# Patient Record
Sex: Female | Born: 2000 | Hispanic: Yes | Marital: Single | Smoking: Never smoker
Health system: Southern US, Community
[De-identification: ages and names within clinical notes are randomized; demographics above are authoritative.]

## PROBLEM LIST (undated history)

## (undated) DIAGNOSIS — Z8709 Personal history of other diseases of the respiratory system: Secondary | ICD-10-CM

## (undated) HISTORY — DX: Personal history of other diseases of the respiratory system: Z87.09

---
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
## 2022-03-26 ENCOUNTER — Other Ambulatory Visit (HOSPITAL_COMMUNITY)
Admission: RE | Admit: 2022-03-26 | Discharge: 2022-03-26 | Disposition: A | Discharge status: Home / Self Care | Payer: Medicaid Other | Source: Ambulatory Visit | Attending: Pediatrics | Admitting: Pediatrics

## 2022-03-26 ENCOUNTER — Ambulatory Visit (INDEPENDENT_AMBULATORY_CARE_PROVIDER_SITE_OTHER): Payer: Medicaid Other | Admitting: Pediatrics

## 2022-03-26 ENCOUNTER — Encounter: Payer: Self-pay | Admitting: Pediatrics

## 2022-03-26 VITALS — BP 102/64 | HR 67 | Ht 59.21 in | Wt 137.2 lb

## 2022-03-26 DIAGNOSIS — Z113 Encounter for screening for infections with a predominantly sexual mode of transmission: Secondary | ICD-10-CM

## 2022-03-26 DIAGNOSIS — Z1331 Encounter for screening for depression: Secondary | ICD-10-CM

## 2022-03-26 DIAGNOSIS — R5383 Other fatigue: Secondary | ICD-10-CM

## 2022-03-26 DIAGNOSIS — Z23 Encounter for immunization: Secondary | ICD-10-CM

## 2022-03-26 DIAGNOSIS — Z114 Encounter for screening for human immunodeficiency virus [HIV]: Secondary | ICD-10-CM | POA: Diagnosis not present

## 2022-03-26 DIAGNOSIS — Z0001 Encounter for general adult medical examination with abnormal findings: Secondary | ICD-10-CM | POA: Diagnosis not present

## 2022-03-26 DIAGNOSIS — Z130 Encounter for screening for diseases of the blood and blood-forming organs and certain disorders involving the immune mechanism: Secondary | ICD-10-CM | POA: Diagnosis not present

## 2022-03-26 DIAGNOSIS — Z1339 Encounter for screening examination for other mental health and behavioral disorders: Secondary | ICD-10-CM | POA: Diagnosis not present

## 2022-03-26 DIAGNOSIS — L700 Acne vulgaris: Secondary | ICD-10-CM

## 2022-03-26 LAB — POCT RAPID HIV: Rapid HIV, POC: NEGATIVE

## 2022-03-26 LAB — POCT HEMOGLOBIN: Hemoglobin: 13.4 g/dL (ref 11–14.6)

## 2022-03-26 MED ORDER — ADAPALENE 0.1 % EX CREA: TOPICAL_CREAM | CUTANEOUS | 6 refills | Status: AC

## 2022-03-26 MED ORDER — LEVOCETIRIZINE DIHYDROCHLORIDE 5 MG PO TABS: 5.0000 mg | ORAL_TABLET | Freq: Every evening | ORAL | 6 refills | Status: DC

## 2022-03-26 MED ORDER — TRIAMCINOLONE ACETONIDE 0.025 % EX OINT: 1.0000 | TOPICAL_OINTMENT | Freq: Two times a day (BID) | CUTANEOUS | 0 refills | Status: AC

## 2022-03-26 MED ORDER — CLINDAMYCIN PHOS-BENZOYL PEROX 1-5 % EX GEL: Freq: Every day | CUTANEOUS | 4 refills | Status: DC

## 2022-03-26 MED ORDER — FLUTICASONE PROPIONATE 50 MCG/ACT NA SUSP: 2.0000 | Freq: Every day | NASAL | 12 refills | Status: DC

## 2022-03-26 NOTE — Patient Instructions (Signed)
Adult Primary Care Clinics Name Criteria Services   [facility]  Address: [address] [place], [place] [address]  Phone: [phone] Hours: Monday - Friday 9 AM -6 PM  Types of insurance accepted:  Commercial insurance [facility] (orange card) Medicaid Medicare Uninsured  Language services:  Video and phone interpreters available   Ages 18 and older    Adult primary care Onsite pharmacy Integrated behavioral health Financial assistance counseling Walk-in hours for established patients  Financial assistance counseling hours: Tuesdays 2:00PM - 5:00PM  Thursday 8:30AM - 4:30PM  Space is limited, 10 on Tuesday and 20 on Thursday. It's on first come first serve basis  Name Criteria Services   [facility]  Address: [address] [place], [place] [address]  Phone: [phone]  Hours: Monday - Friday 8:30 AM - 5 PM  Types of insurance accepted:  Commercial insurance Medicaid Medicare Uninsured  Language services:  Video and phone interpreters available   All ages - newborn to adult   Primary care for all ages (children and adults) Integrated behavioral health Nutritionist Financial assistance counseling   Name Criteria Services   [facility]  Located on the ground floor of [facility]  Address: [address]  [place],  [place]  [address]  Phone: [phone]  Hours: Monday - Friday 8:15 AM - 5 PM  Types of insurance accepted:  Commercial insurance Medicaid Medicare Uninsured  Language services:  Video and phone interpreters available   Ages 18 and older   Adult primary care Nutritionist Certified Diabetes Educator  Integrated behavioral health Financial assistance counseling   Name Criteria Services   [facility]  Address: [address] [place], [place] [address]  Phone:  [phone]  Hours: Monday - Friday 8:30 AM - 5 PM    Types of insurance accepted:  Commercial insurance Medicaid Medicare Uninsured  Language services:  Video and phone interpreters available   All ages - newborn to adult   Primary care for all ages (children and adults) Integrated behavioral health Financial assistance counseling      

## 2022-03-26 NOTE — Progress Notes (Signed)
Adolescent Well Care Visit Gwendolyn Walsh is a 21 y.o. female who is here for well care.     PCP:  Lady Deutscher, MD   History was provided by the patient.  Confidentiality was discussed with the patient and, if applicable, with caregiver.   Current Issues: Current concerns include how to get an adult doctor? Spot on hand--washes hands a lot at [employer]. Itches. 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  no  Education: School Grade: completed   Menstruation:   Patient's last menstrual period was 02/20/2022. Menstrual History: normal  Patient has a dental home: yes   Confidential social history: Tobacco?  no Secondhand smoke exposure? no Drugs/ETOH?  no  Sexually Active?  no   Pregnancy Prevention: n/a  Safe at home, in school & in relationships? yes Safe to self?  Yes   Screenings:  The patient completed the Rapid Assessment for Adolescent Preventive Services screening questionnaire and the following topics were identified as risk factors and discussed: drug use, condom use, and birth control  In addition, the following topics were discussed as part of anticipatory guidance: pregnancy prevention, depression/anxiety.  PHQ-9 completed and results indicated 3  Physical Exam:  Vitals:   03/26/22 1026  BP: 102/64  Pulse: 67  SpO2: 97%  Weight: 137 lb 3.2 oz (62.2 kg)  Height: 4' 11.21" (1.504 m)   BP 102/64 (BP Location: Right Arm, Patient Position: Sitting, Cuff Size: Normal)   Pulse 67   Ht 4' 11.21" (1.504 m)   Wt 137 lb 3.2 oz (62.2 kg)   LMP  02/20/2022   SpO2 97%   BMI 27.51 kg/m  Body mass index: body mass index is 27.51 kg/m. Blood pressure %iles are not available for patients who are 18 years or older.  Hearing Screening  Method: Audiometry   500Hz  1000Hz  2000Hz  4000Hz   Right ear 20 20 20 20   Left ear 20 20 20 20    Vision Screening   Right eye Left eye Both eyes  Without correction 20/25 20/25 20/20   With correction     Comments: Does wear glasses but doesn't have them.    General: well developed, no acute distress, gait normal HEENT: PERRL, normal oropharynx, TMs normal bilaterally Neck: supple, no lymphadenopathy CV: RRR no murmur noted PULM: normal aeration throughout all lung fields, no crackles or wheezes Abdomen: soft, non-tender; no masses or HSM Extremities: warm and well perfused Gu: deferred Skin: small areas of dry skin in between digits  Neuro: alert and oriented, moves all extremities equally   Assessment and Plan:  Gwendolyn Walsh is a 21 y.o. female who is here for well care.   #Well teen: -BMI is appropriate for age -Discussed anticipatory guidance including pregnancy/STI prevention, alcohol/drug use, safety in the car and around water -Screens: Hearing screening result:normal; Vision screening result: normal  #Need for vaccination:  -Counseling provided for all vaccine components  Orders Placed This Encounter  Procedures   Flu Vaccine QUAD 44mo+IM (Fluarix, Fluzone & Alfiuria Quad PF)   POCT Rapid HIV   POCT hemoglobin   #Fatigue: poc hgb normal - likely related to new  schedule opening [employer]   #Dry skin dermatitis: - Rx TAC for areas on hands ?dyshidrotic eczema  - continue with good moisturizer frequently  #Acne: - refill meds.   No follow-ups on file.Lady Deutscher, MD

## 2022-03-27 LAB — URINE CYTOLOGY ANCILLARY ONLY
Chlamydia: NEGATIVE
Comment: NEGATIVE
Comment: NORMAL
Neisseria Gonorrhea: NEGATIVE

## 2022-04-08 ENCOUNTER — Emergency Department (HOSPITAL_COMMUNITY): Payer: Medicaid Other

## 2022-04-08 ENCOUNTER — Emergency Department (HOSPITAL_COMMUNITY)
Admission: EM | Admit: 2022-04-08 | Discharge: 2022-04-09 | Discharge status: Against Medical Advice | Payer: Medicaid Other | Attending: Emergency Medicine | Admitting: Emergency Medicine

## 2022-04-08 ENCOUNTER — Encounter (HOSPITAL_COMMUNITY): Payer: Self-pay

## 2022-04-08 DIAGNOSIS — R1013 Epigastric pain: Secondary | ICD-10-CM | POA: Insufficient documentation

## 2022-04-08 DIAGNOSIS — Z5321 Procedure and treatment not carried out due to patient leaving prior to being seen by health care provider: Secondary | ICD-10-CM | POA: Insufficient documentation

## 2022-04-08 DIAGNOSIS — R0789 Other chest pain: Secondary | ICD-10-CM | POA: Diagnosis present

## 2022-04-08 DIAGNOSIS — R1010 Upper abdominal pain, unspecified: Secondary | ICD-10-CM | POA: Insufficient documentation

## 2022-04-08 DIAGNOSIS — R112 Nausea with vomiting, unspecified: Secondary | ICD-10-CM | POA: Insufficient documentation

## 2022-04-08 LAB — CBC WITH DIFFERENTIAL/PLATELET
Abs Immature Granulocytes: 0.03 10*3/uL (ref 0.00–0.07)
Basophils Absolute: 0 10*3/uL (ref 0.0–0.1)
Basophils Relative: 0 %
Eosinophils Absolute: 0.1 10*3/uL (ref 0.0–0.5)
Eosinophils Relative: 1 %
HCT: 45.8 % (ref 36.0–46.0)
Hemoglobin: 15 g/dL (ref 12.0–15.0)
Immature Granulocytes: 0 %
Lymphocytes Relative: 13 %
Lymphs Abs: 1.1 10*3/uL (ref 0.7–4.0)
MCH: 29.1 pg (ref 26.0–34.0)
MCHC: 32.8 g/dL (ref 30.0–36.0)
MCV: 88.9 fL (ref 80.0–100.0)
Monocytes Absolute: 0.3 10*3/uL (ref 0.1–1.0)
Monocytes Relative: 3 %
Neutro Abs: 6.9 10*3/uL (ref 1.7–7.7)
Neutrophils Relative %: 83 %
Platelets: 289 10*3/uL (ref 150–400)
RBC: 5.15 MIL/uL — ABNORMAL HIGH (ref 3.87–5.11)
RDW: 12.5 % (ref 11.5–15.5)
WBC: 8.3 10*3/uL (ref 4.0–10.5)
nRBC: 0 % (ref 0.0–0.2)

## 2022-04-08 LAB — COMPREHENSIVE METABOLIC PANEL
ALT: 13 U/L (ref 0–44)
AST: 19 U/L (ref 15–41)
Albumin: 4.3 g/dL (ref 3.5–5.0)
Alkaline Phosphatase: 50 U/L (ref 38–126)
Anion gap: 9 (ref 5–15)
BUN: 9 mg/dL (ref 6–20)
CO2: 26 mmol/L (ref 22–32)
Calcium: 9.1 mg/dL (ref 8.9–10.3)
Chloride: 104 mmol/L (ref 98–111)
Creatinine, Ser: 0.78 mg/dL (ref 0.44–1.00)
GFR, Estimated: >60 mL/min (ref >60)
Glucose, Bld: 112 mg/dL — ABNORMAL HIGH (ref 70–99)
Potassium: 3.7 mmol/L (ref 3.5–5.1)
Sodium: 139 mmol/L (ref 135–145)
Total Bilirubin: 0.5 mg/dL (ref 0.3–1.2)
Total Protein: 7.3 g/dL (ref 6.5–8.1)

## 2022-04-08 LAB — I-STAT BETA HCG BLOOD, ED (MC, WL, AP ONLY): I-stat hCG, quantitative: <5 m[IU]/mL (ref <5)

## 2022-04-08 LAB — LIPASE, BLOOD: Lipase: 39 U/L (ref 11–51)

## 2022-04-08 MED ORDER — ONDANSETRON 4 MG PO TBDP
4.0000 mg | ORAL_TABLET | Freq: Once | ORAL | Status: AC
Start: 2022-04-08 — End: 2022-04-08
  Administered 2022-04-08: 4 mg via ORAL
  Filled 2022-04-08: qty 1

## 2022-04-08 NOTE — ED Provider Triage Note (Signed)
Emergency Medicine Provider Triage Evaluation Note  Gwendolyn Walsh , a 21 y.o. female  was evaluated in triage.  Pt complains of chest pain that started yesterday just left of mid chest.  It was intermittent.  Today she began to feel worse with nausea.  She is actively vomiting after arrival to triage.  She reports upper abdominal pain as well.  Denies NSAIDs, has taken some Tylenol.  No abdominal surgery history.  Pain is in the epigastrium.  Denies URI symptoms.  History interrupted by vomiting.  Review of Systems  Positive: Chest pain, abdominal pain, vomiting Negative: Fever  Physical Exam  LMP 02/20/2022  Gen:   Awake, actively vomiting Resp:  Normal effort  MSK:   Moves extremities without difficulty  Other:    Medical Decision Making  Medically screening exam initiated at 10:08 PM.  Appropriate orders placed.  Gwendolyn Walsh was informed that the remainder of the evaluation will be completed by another provider, this initial triage assessment does not replace that evaluation, and the importance of remaining in the ED until their evaluation is complete.     Renne Crigler, PA-C 04/08/22 2210

## 2022-04-08 NOTE — ED Triage Notes (Signed)
Pt reports that she has been having CP since yesterday and abd pain today with nausea

## 2022-04-09 ENCOUNTER — Encounter: Payer: Self-pay | Admitting: Pediatrics

## 2022-04-09 ENCOUNTER — Other Ambulatory Visit: Payer: Self-pay | Admitting: Pediatrics

## 2022-04-09 ENCOUNTER — Ambulatory Visit (INDEPENDENT_AMBULATORY_CARE_PROVIDER_SITE_OTHER): Payer: Medicaid Other | Admitting: Pediatrics

## 2022-04-09 ENCOUNTER — Other Ambulatory Visit: Payer: Self-pay

## 2022-04-09 VITALS — HR 84 | Temp 98.4°F | Wt 135.2 lb

## 2022-04-09 DIAGNOSIS — R079 Chest pain, unspecified: Secondary | ICD-10-CM

## 2022-04-09 DIAGNOSIS — K219 Gastro-esophageal reflux disease without esophagitis: Secondary | ICD-10-CM | POA: Insufficient documentation

## 2022-04-09 DIAGNOSIS — Z0000 Encounter for general adult medical examination without abnormal findings: Secondary | ICD-10-CM

## 2022-04-09 HISTORY — DX: Chest pain, unspecified: R07.9

## 2022-04-09 MED ORDER — OMEPRAZOLE 20 MG PO CPDR: 20.0000 mg | DELAYED_RELEASE_CAPSULE | Freq: Every day | ORAL | 0 refills | Status: DC

## 2022-04-09 NOTE — Assessment & Plan Note (Signed)
Differential remains broad given the vague presentation.  No evidence of pulmonary embolism given hemodynamically stable in office without evidence of tachypnea and normal oxygen saturation.  She does not appear to have an infectious etiology as she has no fever/chills or systemic symptoms.  Chest x-ray unremarkable.  She could have a component of GERD for which we are treating with PPI in addition to anxiety or inflammatory pulmonary symptoms after starting vaping.  Patient has stopped vaping at this time, discussed that she needs to discontinue this for good.  Patient does not have any history of cardiac complications nor does she seem to have any red flag symptoms that would point to concerns for MI, ischemia, arrhythmia.  Patient remains euvolemic on exam and EKG unremarkable from the ED visit.  She does have Zofran at home for which she is using for vomiting.  The plan moving forward will consist of a PPI that she will need to use daily for the next few weeks in addition to Zofran as needed.  If she continues to experience the chest pain, I will have her return to the family medicine clinic as she is of age for an adult provider.  Phone number for family medicine clinic provided.  She was instructed to continue with a new patient appointment with me at [facility] in the next couple of weeks if the chest pain persists for likely repeat EKG and further assessment.  Reassuringly, abdominal exam is unremarkable and she is nonperitoneal.  There is no concern for STI, ectopic pregnancy, ovarian torsion given the history provided.  Return precautions given.  She does need to hepatitis C lab, no lab provider here today.  Will obtain this at her visit at [facility].

## 2022-04-09 NOTE — Progress Notes (Signed)
Acute Office Visit  Subjective:     Patient ID: Gwendolyn Walsh, female    DOB: 2000/07/30, 21 y.o.   MRN: [id number]  Chief Complaint  Patient presents with   Chest Pain    Mid left sided chest pain started Saturday after vaping.  Sunday morning pain moved to upper abdomen.     HPI Patient is in today for onset of chest pain after she began vaping approx a month ago. She had chest pain start on Saturday, 04/07/22. Sunday, the pain was worse to the left side of the chest and in the epigastrium. The patient also started feeling nauseous. She was concerned because of her recent onset of vaping, so she was seen in the ED. The chest pain is still present today but not as severe as it was yesterday, described as dull.  Patient denies that the pain is reproducible nor is it pleuritic in nature.  The pain does not seem to be worse or better with any positional changes.  Patient also reports of lower abdominal pain bilaterally that started yesterday.  One episode of vomiting yesterday, normal movements without any history of constipation.  The patient denies any familial history of sudden death. Patient denies any cardiac history in the family or in herself. Patient denies shortness of breath, lower extremity swelling. Patient does not on any medication and is not using birth control  She denies any history of STIs, was sexually active in August 2023 but has not been since.  LMP was last week.  In the emergency room, she was not seen by provider and left prior to this.  However, she did receive an EKG that showed sinus tachycardia with normal intervals and no evidence of infarct/ischemia.  Chest x-ray was unremarkable as well.  They did get initial lab work with CBC that was also unremarkable.  Review of Systems  Constitutional:  Negative for chills, fever and weight loss.  HENT:  Negative for congestion and sore throat.   Eyes:  Negative for blurred vision.  Respiratory:  Negative for cough,  sputum production and shortness of breath.   Cardiovascular:  Positive for chest pain (see H&P). Negative for palpitations, orthopnea, claudication and leg swelling.  Gastrointestinal:  Positive for heartburn and vomiting (x1 episode). Negative for blood in stool, constipation, diarrhea and nausea.  Genitourinary:  Negative for dysuria and urgency.  Musculoskeletal:  Negative for myalgias and neck pain.  Skin:  Negative for rash.  Neurological:  Negative for dizziness.  Psychiatric/Behavioral:  Negative for depression. The patient is nervous/anxious.         Objective:    Pulse 84   Temp 98.4 F (36.9 C) (Oral)   Wt 135 lb 3.2 oz (61.3 kg)   LMP 02/20/2022   SpO2 98%   BMI 27.11 kg/m  BP Readings from Last 3 Encounters:  04/08/22 114/78  03/26/22 102/64  08/11/21 108/76      Physical Exam Vitals reviewed.  Constitutional:      Appearance: Normal appearance.  HENT:     Head: Normocephalic.     Nose: Nose normal.     Mouth/Throat:     Mouth: Mucous membranes are moist.  Eyes:     Conjunctiva/sclera: Conjunctivae normal.  Cardiovascular:     Rate and Rhythm: Normal rate and regular rhythm.     Heart sounds: Normal heart sounds.     No diastolic murmur is present.     No friction rub.  Pulmonary:  Effort: Pulmonary effort is normal. No tachypnea.     Breath sounds: Normal breath sounds.  Musculoskeletal:     Cervical back: Normal range of motion.  Skin:    Capillary Refill: Capillary refill takes less than 2 seconds.  Neurological:     General: No focal deficit present.     Mental Status: She is alert.  Psychiatric:        Mood and Affect: Mood normal.        Behavior: Behavior normal.     Results for orders placed or performed during the hospital encounter of 04/08/22  CBC with Differential  Result Value Ref Range   WBC 8.3 4.0 - 10.5 K/uL   RBC 5.15 (H) 3.87 - 5.11 MIL/uL   Hemoglobin 15.0 12.0 - 15.0 g/dL   HCT 40.9 81.1 - 91.4 %   MCV 88.9 80.0 -  100.0 fL   MCH 29.1 26.0 - 34.0 pg   MCHC 32.8 30.0 - 36.0 g/dL   RDW 78.2 95.6 - 21.3 %   Platelets 289 150 - 400 K/uL   nRBC 0.0 0.0 - 0.2 %   Neutrophils Relative % 83 %   Neutro Abs 6.9 1.7 - 7.7 K/uL   Lymphocytes Relative 13 %   Lymphs Abs 1.1 0.7 - 4.0 K/uL   Monocytes Relative 3 %   Monocytes Absolute 0.3 0.1 - 1.0 K/uL   Eosinophils Relative 1 %   Eosinophils Absolute 0.1 0.0 - 0.5 K/uL   Basophils Relative 0 %   Basophils Absolute 0.0 0.0 - 0.1 K/uL   Immature Granulocytes 0 %   Abs Immature Granulocytes 0.03 0.00 - 0.07 K/uL  Lipase, blood  Result Value Ref Range   Lipase 39 11 - 51 U/L  Comprehensive metabolic panel  Result Value Ref Range   Sodium 139 135 - 145 mmol/L   Potassium 3.7 3.5 - 5.1 mmol/L   Chloride 104 98 - 111 mmol/L   CO2 26 22 - 32 mmol/L   Glucose, Bld 112 (H) 70 - 99 mg/dL   BUN 9 6 - 20 mg/dL   Creatinine, Ser 0.86 0.44 - 1.00 mg/dL   Calcium 9.1 8.9 - 57.8 mg/dL   Total Protein 7.3 6.5 - 8.1 g/dL   Albumin 4.3 3.5 - 5.0 g/dL   AST 19 15 - 41 U/L   ALT 13 0 - 44 U/L   Alkaline Phosphatase 50 38 - 126 U/L   Total Bilirubin 0.5 0.3 - 1.2 mg/dL   GFR, Estimated >46 >96 mL/min   Anion gap 9 5 - 15  I-Stat beta hCG blood, ED  Result Value Ref Range   I-stat hCG, quantitative <5.0 <5 mIU/mL   Comment 3                Assessment & Plan:   Problem List Items Addressed This Visit     Chest pain    Differential remains broad given the vague presentation.  No evidence of pulmonary embolism given hemodynamically stable in office without evidence of tachypnea and normal oxygen saturation.  She does not appear to have an infectious etiology as she has no fever/chills or systemic symptoms.  Chest x-ray unremarkable.  She could have a component of GERD for which we are treating with PPI in addition to anxiety or inflammatory pulmonary symptoms after starting vaping.  Patient has stopped vaping at this time, discussed that she needs to discontinue  this for good.  Patient does not have any history  of cardiac complications nor does she seem to have any red flag symptoms that would point to concerns for MI, ischemia, arrhythmia.  Patient remains euvolemic on exam and EKG unremarkable from the ED visit.  She does have Zofran at home for which she is using for vomiting.  The plan moving forward will consist of a PPI that she will need to use daily for the next few weeks in addition to Zofran as needed.  If she continues to experience the chest pain, I will have her return to the family medicine clinic as she is of age for an adult provider.  Phone number for family medicine clinic provided.  She was instructed to continue with a new patient appointment with me at [facility] in the next couple of weeks if the chest pain persists for likely repeat EKG and further assessment.  Reassuringly, abdominal exam is unremarkable and she is nonperitoneal.  There is no concern for STI, ectopic pregnancy, ovarian torsion given the history provided.  Return precautions given.  She does need to hepatitis C lab, no lab provider here today.  Will obtain this at her visit at [facility].      Gastroesophageal reflux disease - Primary    Abdominal pain in the epigastrium with vague chest pain, will treat with PPI.  Omeprazole 20 mg sent to pharmacy.      Relevant Medications   omeprazole (PRILOSEC) 20 MG capsule   Other Visit Diagnoses     Healthcare maintenance       Relevant Orders   Hepatitis C Antibody       Meds ordered this encounter  Medications   omeprazole (PRILOSEC) 20 MG capsule    Sig: Take 1 capsule (20 mg total) by mouth daily.    Dispense:  30 capsule    Refill:  0    Return for Adult visit at [facility] .  Alfredo Martinez, MD

## 2022-04-09 NOTE — ED Notes (Signed)
Called pt x3 for room, no response. 

## 2022-04-09 NOTE — ED Notes (Addendum)
Jane(EMT) went around this morning checking vitals, pt not located.

## 2022-04-09 NOTE — Assessment & Plan Note (Signed)
Abdominal pain in the epigastrium with vague chest pain, will treat with PPI.  Omeprazole 20 mg sent to pharmacy.

## 2022-04-09 NOTE — Patient Instructions (Addendum)
It was great to see you today! Thank you for choosing [facility] for your primary care. Gwendolyn Walsh was seen for follow up.  Today we addressed: Please continue with the Prilosec daily for the next couple of weeks  You can also use tylenol and ibuprofen for the symptoms  I would purchase miralax to help with the abdominal pain if you experience constipation  PLEASE DO NOT VAPE ANYMORE  If the symptoms persist, PLEASE return next week.   If you haven't already, sign up for My Chart to have easy access to your labs results, and communication with your primary care physician.  I recommend that you always bring your medications to each appointment as this makes it easy to ensure you are on the correct medications and helps [place] not miss refills when you need them. Call the clinic at [phone] if your symptoms worsen or you have any concerns.  You should return to our clinic Return for Adult visit at [facility] . Please arrive 15 minutes before your appointment to ensure smooth check in process.  We appreciate your efforts in making this happen.  Thank you for allowing me to participate in your care, Alfredo Martinez, MD 04/09/2022, 10:18 AM PGY-2, [facility]   PLEASE CALL [phone] for your adult visit at [facility]

## 2022-05-06 ENCOUNTER — Other Ambulatory Visit: Payer: Self-pay | Admitting: Student

## 2022-05-06 DIAGNOSIS — K219 Gastro-esophageal reflux disease without esophagitis: Secondary | ICD-10-CM

## 2022-06-01 ENCOUNTER — Ambulatory Visit
Admission: EM | Admit: 2022-06-01 | Discharge: 2022-06-01 | Disposition: A | Discharge status: Home / Self Care | Payer: Medicaid Other | Attending: Nurse Practitioner | Admitting: Nurse Practitioner

## 2022-06-01 DIAGNOSIS — H00011 Hordeolum externum right upper eyelid: Secondary | ICD-10-CM | POA: Diagnosis not present

## 2022-06-01 MED ORDER — ERYTHROMYCIN 5 MG/GM OP OINT: TOPICAL_OINTMENT | OPHTHALMIC | 0 refills | Status: AC

## 2022-06-01 NOTE — Discharge Instructions (Addendum)
Erythromycin antibiotic ointment to the right upper eyelid lash line 3 times a day for 7 days Warm compresses to the eye as needed Follow-up with your PCP in 2 to 3 days for recheck Please go to the emergency room if you have any worsening symptoms

## 2022-06-01 NOTE — ED Triage Notes (Signed)
Pt states Monday she began having right eye pain and Tuesday she woke up with swelling to the right eyelid. Pt denies vision changes.   There is inflammation to the right eye lid.   Home interventions: prednisolone acetate eye drops

## 2022-06-01 NOTE — ED Provider Notes (Signed)
[facility]    CSN: [id number] Arrival date & time: 06/01/22  1751      History   Chief Complaint Chief Complaint  Patient presents with   Eye Problem    HPI Gwendolyn Walsh is a 22 y.o. female presents for evaluation of right upper eyelid infection.  Patient reports 3 days of redness, mild swelling of the mid right upper eyelid.  No drainage, warmth, fevers, chills.  No facial swelling.  Denies injury to the area.  She does get last extensions but has not had any fills recently.  She has been using a steroid eyedrop that her aunt gave her with no improvement.  No other concerns at this time.   Eye Problem   Past Medical History:  Diagnosis Date   History of strep sore throat     Patient Active Problem List   Diagnosis Date Noted   Gastroesophageal reflux disease 04/09/2022   Chest pain 04/09/2022   Flat wart 02/21/2018   Vitamin D deficiency 02/27/2017   BMI (body mass index), pediatric, 85% to less than 95% for age 15/11/2014   Academic underachievement 07/01/2014   Acne vulgaris- mild 07/01/2014   Keratosis pilaris 07/01/2014    History reviewed. No pertinent surgical history.  OB History   No obstetric history on file.      Home Medications    Prior to Admission medications   Medication Sig Start Date End Date Taking? Authorizing Provider  erythromycin ophthalmic ointment Place a 1/2 inch ribbon of ointment into the right upper  eyelid lash line 3 times a day for 7 days. 06/01/22  Yes Melynda Ripple, NP  adapalene (DIFFERIN) 0.1 % cream Apply to acne at bedtime after washing face 03/26/22   Alma Friendly, MD  clindamycin-benzoyl peroxide [facility]) gel Apply topically daily. 03/26/22   Alma Friendly, MD  Multiple Vitamin (MULTIVITAMIN WITH MINERALS) TABS tablet Take 1 tablet by mouth daily.    [provider]  omeprazole (PRILOSEC) 20 MG capsule TAKE 1 CAPSULE(20 MG) BY MOUTH DAILY 05/09/22   Alma Friendly, MD    Family  History Family History  Problem Relation Age of Onset   Hypertension Maternal Grandmother    Cancer Maternal Grandfather     Social History Social History   Tobacco Use   Smoking status: Never    Passive exposure: Yes   Smokeless tobacco: Never     Allergies   Patient has no known allergies.   Review of Systems Review of Systems  Eyes:        Right upper eyelid infection      Physical Exam Triage Vital Signs ED Triage Vitals  Enc Vitals Group     BP 06/01/22 1012 92/64     Pulse Rate 06/01/22 1012 88     Resp 06/01/22 1012 16     Temp 06/01/22 1012 99.5 F (37.5 C)     Temp Source 06/01/22 1012 Oral     SpO2 06/01/22 1012 96 %     Weight --      Height --      Head Circumference --      Peak Flow --      Pain Score 06/01/22 1018 0     Pain Loc --      Pain Edu? --      Excl. in [place]? --    No data found.  Updated Vital Signs BP 92/64 (BP Location: Right Arm)   Pulse 88   Temp  99.5 F (37.5 C) (Oral)   Resp 16   LMP 05/02/2022   SpO2 96%   Visual Acuity Right Eye Distance:   Left Eye Distance:   Bilateral Distance:    Right Eye Near:   Left Eye Near:    Bilateral Near:     Physical Exam Vitals and nursing note reviewed.  Constitutional:      Appearance: Normal appearance.  HENT:     Head: Normocephalic and atraumatic.  Eyes:     Pupils: Pupils are equal, round, and reactive to light.   Skin:    General: Skin is warm and dry.  Neurological:     General: No focal deficit present.     Mental Status: She is alert and oriented to person, place, and time.  Psychiatric:        Mood and Affect: Mood normal.        Behavior: Behavior normal.      UC Treatments / Results  Labs (all labs ordered are listed, but only abnormal results are displayed) Labs Reviewed - No data to display  EKG   Radiology No results found.  Procedures Procedures (including critical care time)  Medications Ordered in UC Medications - No data to  display  Initial Impression / Assessment and Plan / UC Course  I have reviewed the triage vital signs and the nursing notes.  Pertinent labs & imaging results that were available during my care of the patient were reviewed by me and considered in my medical decision making (see chart for details).  Clinical Course as of 06/01/22 1039  Fri Jun 01, 2022  1039 Temp recheck 98.9 [JM]    Clinical Course User Index [JM] Melynda Ripple, NP    Reviewed exam and symptoms with patient.  No red flags on exam Discussed stye.  Given she gets eyelash extensions will do erythromycin ointment Warm compresses as needed Advised to stop using steroid eyedrops that were not prescribed for Avoid any lash refills until symptoms completely resolve Follow-up with PCP 2 to 3 days for recheck ER precautions reviewed and patient verbalized understanding Final Clinical Impressions(s) / UC Diagnoses   Final diagnoses:  Hordeolum externum of right upper eyelid     Discharge Instructions      Erythromycin antibiotic ointment to the right upper eyelid lash line 3 times a day for 7 days Warm compresses to the eye as needed Follow-up with your PCP in 2 to 3 days for recheck Please go to the emergency room if you have any worsening symptoms   ED Prescriptions     Medication Sig Dispense Auth. Provider   erythromycin ophthalmic ointment Place a 1/2 inch ribbon of ointment into the right upper  eyelid lash line 3 times a day for 7 days. 3.5 g Melynda Ripple, NP      PDMP not reviewed this encounter.   Melynda Ripple, NP 06/01/22 [phone]

## 2022-06-12 ENCOUNTER — Ambulatory Visit: Payer: Medicaid Other | Admitting: Student

## 2022-06-12 ENCOUNTER — Encounter: Payer: Self-pay | Admitting: Family Medicine

## 2022-06-12 ENCOUNTER — Ambulatory Visit (INDEPENDENT_AMBULATORY_CARE_PROVIDER_SITE_OTHER): Payer: Medicaid Other | Admitting: Family Medicine

## 2022-06-12 VITALS — BP 104/62 | HR 67 | Ht 59.0 in | Wt 133.8 lb

## 2022-06-12 DIAGNOSIS — Z7689 Persons encountering health services in other specified circumstances: Secondary | ICD-10-CM | POA: Diagnosis not present

## 2022-06-12 DIAGNOSIS — H00011 Hordeolum externum right upper eyelid: Secondary | ICD-10-CM | POA: Diagnosis not present

## 2022-06-12 NOTE — Patient Instructions (Signed)
It was wonderful to see you today.  Please bring ALL of your medications with you to every visit.   Today we talked about:  Today at your annual preventive visit we talked about the following measures: I recommend 150 minutes of exercise per week-try 30 minutes 5 days per week We discussed reducing sugary beverages (like soda and juice) and increasing leafy greens and whole fruits.  We discussed avoiding tobacco and alcohol.  I recommend avoiding illicit substances.  Your blood pressure is at goal of <120/80.    Thank you for coming to your visit as scheduled. We have had a large "no-show" problem lately, and this significantly limits our ability to see and care for patients. As a friendly reminder- if you cannot make your appointment please call to cancel. We do have a no show policy for those who do not cancel within 24 hours. Our policy is that if you miss or fail to cancel an appointment within 24 hours, 3 times in a 34-month period, you may be dismissed from our clinic.   Thank you for choosing [facility].   Please call [phone] with any questions about today's appointment.  Please be sure to schedule follow up at the front  desk before you leave today.   Sharion Settler, DO PGY-3 Family Medicine

## 2022-06-12 NOTE — Progress Notes (Signed)
Subjective:    Patient ID: Gwendolyn Walsh, female    DOB: Apr 28, 2001, 22 y.o.   MRN: [id number]   CC: Establish care  HPI:  Gwendolyn Walsh is a very pleasant 22 y.o. female who presents today to establish care.  Initial concerns: None  Past medical history: GERD   Past surgical history: None   Current medications: erythromycin ointment for hordeolum currently   Family history: None   Social history: Works at [employer]. Lives with mom, brother, and dog. Previously vaped, not any more. Non-drinker. Works out at [employer], 1-2 times a week for about 2.5 hours. Not sexually active.   ROS: pertinent noted in the HPI   Objective:  BP 104/62   Pulse 67   Ht 4\' 11"  (1.499 m)   Wt 133 lb 12.8 oz (60.7 kg)   LMP 06/05/2022 (Approximate)   SpO2 98%   BMI 27.02 kg/m   Vitals and nursing note reviewed  General: Awake, alert, oriented, in no acute distress, pleasant and cooperative with examination HEENT: Normocephalic, atraumatic, nares patent, dentition is good, oropharynx without erythema or exudates, TM's clear bilaterally, no thyroid nodules palpated.  Hordeolum present to right upper eyelid. Cardio: RRR without murmur Respiratory: CTAB without wheezing/rhonchi/rales Abdomen: Soft, non-tender to palpation of all quadrants, non-distended, no rebound/guarding MSK: Able to move all extremities spontaneously, good muscle strength, no abnormalities Extremities: without edema or cyanosis Neuro: Speech is clear and intact, no focal deficits, no facial asymmetry, follows commands  Psych: Normal mood and affect  Assessment & Plan:   1. Establishing care with new doctor, encounter for Healthy 22 y/o female. UTD on vaccines. Discussed safe sex practices as well as dietary and exercise recommendations. No significant Fhx. Return PRN.   2. Hordeolum externum of right upper eyelid Taking erythromycin ointment. Recommend warm compresses PRN. Pupils equal, round, and reactive to light.  Denies any vision changes. Continue current management.     Sabino Dick, DO Family Medicine Resident

## 2023-05-14 ENCOUNTER — Ambulatory Visit: Payer: Medicaid Other | Admitting: Family Medicine

## 2024-05-13 ENCOUNTER — Encounter: Payer: Self-pay | Admitting: Family Medicine

## 2024-05-19 ENCOUNTER — Encounter: Payer: Self-pay | Admitting: Family Medicine

## 2024-05-19 ENCOUNTER — Ambulatory Visit (INDEPENDENT_AMBULATORY_CARE_PROVIDER_SITE_OTHER): Payer: Self-pay | Admitting: Family Medicine

## 2024-05-19 ENCOUNTER — Other Ambulatory Visit (HOSPITAL_COMMUNITY)
Admission: RE | Admit: 2024-05-19 | Discharge: 2024-05-19 | Disposition: A | Discharge status: Home / Self Care | Source: Ambulatory Visit | Attending: Family Medicine | Admitting: Family Medicine

## 2024-05-19 VITALS — BP 110/74 | HR 98 | Ht 60.0 in | Wt 150.2 lb

## 2024-05-19 DIAGNOSIS — Z113 Encounter for screening for infections with a predominantly sexual mode of transmission: Secondary | ICD-10-CM

## 2024-05-19 DIAGNOSIS — E781 Pure hyperglyceridemia: Secondary | ICD-10-CM | POA: Diagnosis not present

## 2024-05-19 DIAGNOSIS — Z0000 Encounter for general adult medical examination without abnormal findings: Secondary | ICD-10-CM | POA: Diagnosis present

## 2024-05-19 DIAGNOSIS — Z124 Encounter for screening for malignant neoplasm of cervix: Secondary | ICD-10-CM

## 2024-05-19 DIAGNOSIS — R3989 Other symptoms and signs involving the genitourinary system: Secondary | ICD-10-CM

## 2024-05-19 DIAGNOSIS — R350 Frequency of micturition: Secondary | ICD-10-CM

## 2024-05-19 LAB — POCT URINALYSIS DIP (MANUAL ENTRY)
Bilirubin, UA: NEGATIVE
Glucose, UA: NEGATIVE mg/dL
Ketones, POC UA: NEGATIVE mg/dL
Nitrite, UA: NEGATIVE
Protein Ur, POC: NEGATIVE mg/dL
Spec Grav, UA: 1.02
Urobilinogen, UA: 0.2 U/dL
pH, UA: 6

## 2024-05-19 LAB — POCT UA - MICROSCOPIC ONLY

## 2024-05-19 NOTE — Progress Notes (Unsigned)
" ° ° °  SUBJECTIVE:   Chief compliant/HPI: annual examination  Gwendolyn Walsh is a 24 y.o. who presents today for an annual exam.   Review of systems form notable for rash on flexural surfaces on arms .  Patient also notes urinary frequency for the last few days but no dysuria or urgency. No fevers or chills.   Updated history tabs and problem list.   OBJECTIVE:   BP 110/74   Pulse 98   Ht 4' 11 (1.499 m)   Wt 150 lb 4 oz (68.2 kg)   LMP 05/10/2024   SpO2 98%   BMI 30.35 kg/m   General: well appearing, in no acute distress CV: RRR, radial pulses equal and palpable, no BLE edema  Resp: Normal work of breathing on room air, CTAB Abd: Soft, non tender, non distended  Neuro: Alert & Oriented  Derm: multiple acneic pustules and papules on face dispersed, left flexural surface of antecubital fossa with few erythematous macules slightly dry.   ASSESSMENT/PLAN:   Assessment & Plan Pure hypertriglyceridemia H/o elevated triglycerides 7 years ago. Ordered lipid panel today  Urinary frequency No symptoms of dysuria or urgency. UTI possible though less likely without other symptoms.  - UA  Annual physical exam Routine screening for STI (sexually transmitted infection) Pap smear for cervical cancer screening Annual Examination  See AVS for age appropriate recommendations.   PHQ score 7, reviewed and discussed. Blood pressure reviewed and at goal.  Asked about intimate partner violence and patient reports none.  The patient currently uses condoms for contraception. Folate recommended as appropriate, minimum of 400 mcg per day. Counseled patient regarding contraception given sexually active. Patient declined at this time but will contemplate.    Considered the following items based upon USPSTF recommendations: HIV testing:ordered Hepatitis C: ordered Hepatitis B:ordered Syphilis if at high risk: ordered GC/CT 24 or  younger, ordered. Lipid panel ordered based upon AHA  recommendations and ordered.  Consider repeat every 4-6 years. Previously hypertriglycerides.   Discussed family history, BRCA testing not indicated. Tool used to risk stratify was Pedigree Assessment tool negative  Cervical cancer screening: due for Pap today, cytology alone ordered (HPV if ASCUS) Immunizations declines flu shot   MyChart Activation:Already signed up   Follow up in 1 year or sooner if indicated.    Areta Saliva, MD [facility]   "

## 2024-05-19 NOTE — Patient Instructions (Addendum)
 It was wonderful to see you today.  Please bring ALL of your medications with you to every visit.   I believe the small bump on your vulva is not concerning at this time. If it starts to hurt, feel it is getting larger, or starts draining lets us  know.   We are checking your cholesterol, STI screening, and your pap smear today.   Work on psychologist, occupational type exercise.   Work on sleep hygiene, sleeping at a consistent time and not taking naps, not using phone 2 hours before bed. Keep track of your symptoms and if they do not improve we can follow up   Thank you for choosing [facility].   Please call [phone] with any questions about today's appointment. Areta Saliva, MD  Family Medicine

## 2024-05-20 LAB — CERVICOVAGINAL ANCILLARY ONLY
Chlamydia: NEGATIVE
Comment: NEGATIVE
Comment: NEGATIVE
Comment: NORMAL
Neisseria Gonorrhea: NEGATIVE
Trichomonas: NEGATIVE

## 2024-05-20 LAB — LIPID PANEL
Chol/HDL Ratio: 4.1 ratio (ref 0.0–4.4)
Cholesterol, Total: 180 mg/dL (ref 100–199)
HDL: 44 mg/dL
LDL Chol Calc (NIH): 117 mg/dL — ABNORMAL HIGH (ref 0–99)
Triglycerides: 103 mg/dL (ref 0–149)
VLDL Cholesterol Cal: 19 mg/dL (ref 5–40)

## 2024-05-20 LAB — HIV ANTIBODY (ROUTINE TESTING W REFLEX): HIV Screen 4th Generation wRfx: NONREACTIVE

## 2024-05-20 LAB — HEPATITIS B CORE AB W/REFLEX: Hep B Core Total Ab: NEGATIVE

## 2024-05-20 LAB — HEPATITIS C ANTIBODY: Hep C Virus Ab: NONREACTIVE

## 2024-05-20 LAB — CYTOLOGY - PAP: Diagnosis: NEGATIVE

## 2024-05-20 LAB — SYPHILIS: RPR W/REFLEX TO RPR TITER AND TREPONEMAL ANTIBODIES, TRADITIONAL SCREENING AND DIAGNOSIS ALGORITHM: RPR Ser Ql: NONREACTIVE

## 2024-05-21 ENCOUNTER — Ambulatory Visit: Payer: Self-pay | Admitting: Family Medicine

## 2024-05-26 ENCOUNTER — Ambulatory Visit: Admitting: Family Medicine

## 2024-05-27 ENCOUNTER — Ambulatory Visit: Payer: Self-pay | Admitting: Family Medicine

## 2024-05-27 ENCOUNTER — Encounter: Payer: Self-pay | Admitting: Family Medicine

## 2024-05-27 VITALS — BP 117/75 | HR 124 | Temp 98.9°F | Ht 60.0 in | Wt 146.8 lb

## 2024-05-27 DIAGNOSIS — R000 Tachycardia, unspecified: Secondary | ICD-10-CM

## 2024-05-27 DIAGNOSIS — R6889 Other general symptoms and signs: Secondary | ICD-10-CM | POA: Diagnosis not present

## 2024-05-27 DIAGNOSIS — L700 Acne vulgaris: Secondary | ICD-10-CM

## 2024-05-27 LAB — POC SOFIA 2 FLU + SARS ANTIGEN FIA
Influenza A, POC: POSITIVE — AB
Influenza B, POC: NEGATIVE
SARS Coronavirus 2 Ag: NEGATIVE

## 2024-05-27 MED ORDER — CLINDAMYCIN PHOS-BENZOYL PEROX 1-5 % EX GEL: Freq: Every day | CUTANEOUS | 4 refills | Status: AC

## 2024-05-27 NOTE — Progress Notes (Unsigned)
" ° °  SUBJECTIVE:   CHIEF COMPLAINT / HPI:  Discussed the use of AI scribe software for clinical note transcription with the patient, who gave verbal consent to proceed.  History of Present Illness Gwendolyn Walsh is a 24 year old female who presents with flu-like symptoms and acne follow-up.  Constitutional and upper respiratory symptoms - Body chills, fevers, and body aches for 3 days - Symptoms began after exposure to her brother, who was diagnosed with influenza - Lightheadedness present - No mention of cough, sore throat, or shortness of breath - Maintaining hydration  Oral mucosal symptoms - Irritated tongue with enlarged or burnt taste buds  Acne vulgaris - Daily acne breakouts with lesions clearing and returning the next day - Current skin care routine includes discoloration serum, hyaluronic acid, and moisturizer - No use of salicylic acid products - Past use of benzoyl peroxide gel, but not consistently    PERTINENT  PMH / PSH: Acne vulgaris   OBJECTIVE:  BP 117/75   Pulse (!) 130   Temp 98.9 F (37.2 C) (Oral)   Ht 5' (1.524 m)   Wt 146 lb 12.8 oz (66.6 kg)   LMP 05/10/2024   SpO2 96%   BMI 28.67 kg/m   Physical Exam   General: well appearing, in no acute distress CV: RRR, radial pulses equal and palpable, no BLE edema  Resp: Normal work of breathing on room air, CTAB Abd: Soft, non tender, non distended  Neuro: Alert & Oriented x 4   ASSESSMENT/PLAN:   Assessment & Plan Acne vulgaris- mild  Flu-like symptoms  Tachycardia   Assessment and Plan Assessment & Plan Influenza-like illness Symptoms consistent with influenza-like illness. Antivirals not indicated due to late symptom onset and minimal benefit. - Ordered flu test. - Administer Tylenol and ibuprofen for body aches and sore throat. - Advised use of honey for sore throat.  Acne vulgaris Mild acne with daily breakouts. Current skincare routine lacks salicylic acid and benzoyl peroxide.  Advised to simplify routine to reduce irritation. - Prescribed benzoyl peroxide cleanser for use every other day. - Recommended salicylic acid wash for alternate days. - Advised use of sunscreen daily. - Instructed to simplify skincare routine to cleanser, sunscreen, benzoyl peroxide treatment, and moisturizer. - Took photographs of face to monitor acne progression.     Areta Saliva, MD [facility] "

## 2024-05-27 NOTE — Patient Instructions (Addendum)
 It was wonderful to see you today.  Please bring ALL of your medications with you to every visit.   VISIT SUMMARY: During today's visit, we discussed your flu-like symptoms and acne follow-up. You reported experiencing body chills, fevers, body aches, and lightheadedness for the past three days, which began after exposure to your brother who was diagnosed with influenza. Additionally, you mentioned having an irritated tongue with enlarged or burnt taste buds. We also reviewed your current skincare routine and acne management.  YOUR PLAN: -INFLUENZA-LIKE ILLNESS: Influenza-like illness is a viral infection that causes symptoms such as fever, body aches, and chills. Since your symptoms started a few days ago, antiviral medications are not recommended as they would provide minimal benefit at this stage. We have ordered a flu test to confirm the diagnosis. In the meantime, you can take Tylenol and ibuprofen to help with body aches and sore throat. Using honey can also soothe your sore throat.  -ACNE VULGARIS: Acne vulgaris is a common skin condition that causes pimples and breakouts. Your current skincare routine lacks salicylic acid and benzoyl peroxide, which are effective in treating acne. We have prescribed a benzoyl peroxide gel to use every other day and recommended a salicylic acid wash for the alternate days. It is important to use sunscreen daily to protect your skin. We also advised simplifying your skincare routine to include only a cleanser, sunscreen, benzoyl peroxide treatment, and moisturizer. We took photographs of your face to monitor the progression of your acne.  TACHYCARDIA: This means fast heart rate. Your heart rate was very fast today. This is most likely due to your illness at this time. Try to stay very hydrated. Let us  know if you feel palpitations or abnormal heart beats. We are also going to check your thyroid function in case this is causing fast heart rate.    INSTRUCTIONS: Please follow up with us  if your symptoms worsen or do not improve. Continue with the prescribed skincare routine and let us  know if you experience any side effects or have any questions.  Contains text generated by [employer].   Thank you for choosing [facility].   Please call [phone] with any questions about today's appointment.  Please be sure to schedule follow up at the front desk before you leave today.   Areta Saliva, MD  Family Medicine

## 2024-05-28 LAB — TSH RFX ON ABNORMAL TO FREE T4: TSH: 1.33 u[IU]/mL (ref 0.450–4.500)

## 2024-05-28 NOTE — Assessment & Plan Note (Signed)
 Mild acne with daily breakouts. Current skincare routine lacks salicylic acid and benzoyl peroxide. Advised to simplify routine to reduce irritation. - Prescribed benzoyl peroxide gel for use every other day. - Recommended salicylic acid wash for alternate days. - Advised use of sunscreen daily. - Instructed to simplify skincare routine to cleanser, sunscreen, benzoyl peroxide treatment, and moisturizer. - Took photographs of face to monitor acne progression.

## 2024-05-30 ENCOUNTER — Ambulatory Visit (HOSPITAL_COMMUNITY)
Admission: EM | Admit: 2024-05-30 | Discharge: 2024-05-30 | Disposition: A | Discharge status: Home / Self Care | Attending: Family Medicine | Admitting: Family Medicine

## 2024-05-30 ENCOUNTER — Encounter (HOSPITAL_COMMUNITY): Payer: Self-pay | Admitting: Emergency Medicine

## 2024-05-30 ENCOUNTER — Other Ambulatory Visit: Payer: Self-pay

## 2024-05-30 ENCOUNTER — Telehealth (HOSPITAL_COMMUNITY): Payer: Self-pay | Admitting: Emergency Medicine

## 2024-05-30 DIAGNOSIS — J020 Streptococcal pharyngitis: Secondary | ICD-10-CM

## 2024-05-30 DIAGNOSIS — J029 Acute pharyngitis, unspecified: Secondary | ICD-10-CM | POA: Diagnosis not present

## 2024-05-30 LAB — POCT RAPID STREP A (OFFICE): Rapid Strep A Screen: POSITIVE — AB

## 2024-05-30 MED ORDER — AMOXICILLIN 500 MG PO CAPS: 500.0000 mg | ORAL_CAPSULE | Freq: Two times a day (BID) | ORAL | 0 refills | Status: AC

## 2024-05-30 MED ORDER — AMOXICILLIN 500 MG PO CAPS: 500.0000 mg | ORAL_CAPSULE | Freq: Two times a day (BID) | ORAL | 0 refills | Status: DC

## 2024-05-30 NOTE — Discharge Instructions (Addendum)
 You have strep throat.  - Take the amoxicillin  (prescribed antibiotic) as directed for the next 10 days. - Change your toothbrush after 2 to 3 days of antibiotic use to prevent reinfection. - You may also gargle with salt water and drink warm tea with honey to soothe your throat.  You may take ibuprofen 600mg  (3 over the counter 200mg  pills) every 6 hours as needed for pain and inflammation.  You may take tylenol 1,000mg  (2 over the counter extra strength 500mg  pills) every 6 hours as needed for pain.   If you develop any new or worsening symptoms or if your symptoms do not start to improve, please return here or follow-up with your primary care provider. If your symptoms are severe, please go to the emergency room.

## 2024-05-30 NOTE — ED Provider Notes (Signed)
 " [facility]    CSN: [id number] Arrival date & time: 05/30/24  1135      History   Chief Complaint Chief Complaint  Patient presents with   Cough    HPI Gwendolyn Walsh is a 24 y.o. female.   This 24 year old female is being seen for complaints of bodyaches, chills, sore throat, nasal congestion, nonproductive cough, bilateral ear pain.  She endorses chest tightness with coughing.  Her symptoms started on Sunday.  She reports she saw her primary care on Tuesday who diagnosed her with influenza A.  Her provider felt she was outside the window for Tamiflu.  She has been taking TheraFlu, ibuprofen without significant relief of symptoms.  She denies headache, dizziness.  She denies shortness of breath, abdominal pain, nausea, vomiting, diarrhea.   Cough Associated symptoms: chills, ear pain, myalgias and sore throat   Associated symptoms: no chest pain, no fever, no headaches, no rash and no shortness of breath     Past Medical History:  Diagnosis Date   Chest pain 04/09/2022   History of strep sore throat     Patient Active Problem List   Diagnosis Date Noted   Gastroesophageal reflux disease 04/09/2022   Flat wart 02/21/2018   Vitamin D  deficiency 02/27/2017   BMI (body mass index), pediatric, 85% to less than 95% for age 24/11/2014   Acne vulgaris- mild 07/01/2014   Keratosis pilaris 07/01/2014    History reviewed. No pertinent surgical history.  OB History   No obstetric history on file.      Home Medications    Prior to Admission medications  Medication Sig Start Date End Date Taking? Authorizing Provider  amoxicillin  (AMOXIL ) 500 MG capsule Take 1 capsule (500 mg total) by mouth 2 (two) times daily for 10 days. 05/30/24 06/09/24 Yes Gerald Honea C, FNP  adapalene  (DIFFERIN ) 0.1 % cream Apply to acne at bedtime after washing face Patient not taking: Reported on 06/12/2022 03/26/22   Gretel Andes, MD  clindamycin -benzoyl peroxide (BENZACLIN) gel Apply  topically daily. 05/27/24   Nicholas Bar, MD  erythromycin  ophthalmic ointment Place a 1/2 inch ribbon of ointment into the right upper  eyelid lash line 3 times a day for 7 days. 06/01/22   Mayer, Jodi R, NP  Multiple Vitamin (MULTIVITAMIN WITH MINERALS) TABS tablet Take 1 tablet by mouth daily. Patient not taking: Reported on 06/12/2022    [provider]  omeprazole  (PRILOSEC) 20 MG capsule TAKE 1 CAPSULE(20 MG) BY MOUTH DAILY Patient not taking: Reported on 06/12/2022 05/09/22   Gretel Andes, MD    Family History Family History  Problem Relation Age of Onset   Hypertension Maternal Grandmother    Cancer Maternal Grandfather     Social History Social History[1]   Allergies   Patient has no known allergies.   Review of Systems Review of Systems  Constitutional:  Positive for activity change and chills. Negative for appetite change and fever.  HENT:  Positive for congestion, ear pain and sore throat.   Respiratory:  Positive for cough and chest tightness. Negative for shortness of breath.   Cardiovascular:  Negative for chest pain.  Gastrointestinal:  Negative for abdominal pain, diarrhea, nausea and vomiting.  Musculoskeletal:  Positive for myalgias.  Skin:  Negative for color change and rash.  Neurological:  Negative for dizziness and headaches.  All other systems reviewed and are negative.    Physical Exam Triage Vital Signs ED Triage Vitals  Encounter Vitals Group  BP 05/30/24 1300 99/66     Girls Systolic BP Percentile --      Girls Diastolic BP Percentile --      Boys Systolic BP Percentile --      Boys Diastolic BP Percentile --      Pulse Rate 05/30/24 1300 (!) 110     Resp 05/30/24 1300 18     Temp 05/30/24 1300 98.9 F (37.2 C)     Temp Source 05/30/24 1300 Oral     SpO2 05/30/24 1300 97 %     Weight --      Height --      Head Circumference --      Peak Flow --      Pain Score 05/30/24 1257 8     Pain Loc --      Pain Education --       Exclude from Growth Chart --    No data found.  Updated Vital Signs BP 99/66 (BP Location: Left Arm)   Pulse (!) 110   Temp 98.9 F (37.2 C) (Oral)   Resp 18   LMP 05/10/2024   SpO2 97%   Visual Acuity Right Eye Distance:   Left Eye Distance:   Bilateral Distance:    Right Eye Near:   Left Eye Near:    Bilateral Near:     Physical Exam Vitals and nursing note reviewed.  Constitutional:      General: She is not in acute distress.    Appearance: She is well-developed. She is not toxic-appearing.     Comments: Pleasant female appearing stated age found sitting in chair in no acute distress  HENT:     Head: Normocephalic and atraumatic.     Right Ear: Tympanic membrane and external ear normal.     Left Ear: Tympanic membrane and external ear normal.     Nose: Congestion and rhinorrhea present.     Mouth/Throat:     Lips: Pink.     Pharynx: Posterior oropharyngeal erythema present. No oropharyngeal exudate.     Tonsils: Tonsillar exudate present.  Eyes:     Conjunctiva/sclera: Conjunctivae normal.  Cardiovascular:     Rate and Rhythm: Normal rate and regular rhythm.     Heart sounds: Normal heart sounds. No murmur heard. Pulmonary:     Effort: Pulmonary effort is normal. No respiratory distress.     Breath sounds: Normal breath sounds.  Musculoskeletal:     Cervical back: Neck supple.  Lymphadenopathy:     Cervical: Cervical adenopathy present.  Skin:    General: Skin is warm and dry.     Capillary Refill: Capillary refill takes less than 2 seconds.  Neurological:     Mental Status: She is alert.  Psychiatric:        Mood and Affect: Mood normal.      UC Treatments / Results  Labs (all labs ordered are listed, but only abnormal results are displayed) Labs Reviewed  POCT RAPID STREP A (OFFICE) - Abnormal; Notable for the following components:      Result Value   Rapid Strep A Screen Positive (*)    All other components within normal limits     EKG   Radiology No results found.  Procedures Procedures (including critical care time)  Medications Ordered in UC Medications - No data to display  Initial Impression / Assessment and Plan / UC Course  I have reviewed the triage vital signs and the nursing notes.  Pertinent labs & imaging  results that were available during my care of the patient were reviewed by me and considered in my medical decision making (see chart for details).     Vitals and triage reviewed, patient is hemodynamically stable.  Strep swab obtained and is positive.  She is given prescription for amoxicillin .  Advised supportive care with Tylenol and/or ibuprofen, honey water, salt water gargles.  Plan of care, follow-up care, return precautions given, no questions at this time. Final Clinical Impressions(s) / UC Diagnoses   Final diagnoses:  Sore throat  Strep pharyngitis     Discharge Instructions      You have strep throat.  - Take the amoxicillin  (prescribed antibiotic) as directed for the next 10 days. - Change your toothbrush after 2 to 3 days of antibiotic use to prevent reinfection. - You may also gargle with salt water and drink warm tea with honey to soothe your throat.  You may take ibuprofen 600mg  (3 over the counter 200mg  pills) every 6 hours as needed for pain and inflammation.  You may take tylenol 1,000mg  (2 over the counter extra strength 500mg  pills) every 6 hours as needed for pain.   If you develop any new or worsening symptoms or if your symptoms do not start to improve, please return here or follow-up with your primary care provider. If your symptoms are severe, please go to the emergency room.      ED Prescriptions     Medication Sig Dispense Auth. Provider   amoxicillin  (AMOXIL ) 500 MG capsule Take 1 capsule (500 mg total) by mouth 2 (two) times daily for 10 days. 20 capsule Chisom Muntean C, FNP      PDMP not reviewed this encounter.    [1]  Social  History Tobacco Use   Smoking status: Never    Passive exposure: Yes   Smokeless tobacco: Never  Vaping Use   Vaping status: Never Used  Substance Use Topics   Alcohol use: Yes   Drug use: Yes    Types: Marijuana     Lennice Jon BROCKS, FNP 05/30/24 1350  "

## 2024-05-30 NOTE — Telephone Encounter (Signed)
 Patient request.

## 2024-05-30 NOTE — ED Triage Notes (Signed)
 Complains of cough, body aches, sore throat  Patient reported to pcp on Tuesday and was informed she was positive for flu.  No medications prescribed since past the time frame for tamiflu.    Has been taking theraflu and ibuprofen.    Patient now having bilateral ear pain, frequent coughing, chest hurts with frequent coughing

## 2024-06-19 ENCOUNTER — Ambulatory Visit: Admitting: Family Medicine

## 2024-07-01 ENCOUNTER — Ambulatory Visit: Admitting: Family Medicine

## 2024-08-18 ENCOUNTER — Ambulatory Visit: Payer: Self-pay | Admitting: Family Medicine
